# Patient Record
Sex: Female | Born: 2000 | Race: White | Hispanic: No | Marital: Single | Smoking: Never smoker
Health system: Southern US, Community
[De-identification: ages and names within clinical notes are randomized; demographics above are authoritative.]

---
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
## 2021-06-23 ENCOUNTER — Encounter: Payer: Self-pay | Admitting: Behavioral Health

## 2021-06-23 ENCOUNTER — Other Ambulatory Visit: Payer: Self-pay

## 2021-06-23 ENCOUNTER — Ambulatory Visit: Payer: BC Managed Care – PPO | Admitting: Behavioral Health

## 2021-06-23 VITALS — BP 117/69 | HR 101 | Ht 69.0 in | Wt 160.0 lb

## 2021-06-23 DIAGNOSIS — F411 Generalized anxiety disorder: Secondary | ICD-10-CM

## 2021-06-23 DIAGNOSIS — F410 Panic disorder [episodic paroxysmal anxiety] without agoraphobia: Secondary | ICD-10-CM | POA: Diagnosis not present

## 2021-06-23 DIAGNOSIS — F330 Major depressive disorder, recurrent, mild: Secondary | ICD-10-CM | POA: Diagnosis not present

## 2021-06-23 MED ORDER — SERTRALINE HCL 50 MG PO TABS: ORAL_TABLET | ORAL | 1 refills | Status: DC

## 2021-06-23 MED ORDER — PROPRANOLOL HCL 20 MG PO TABS
20.0000 mg | ORAL_TABLET | Freq: Two times a day (BID) | ORAL | 1 refills | Status: DC
Start: 2021-06-23 — End: 2021-07-25

## 2021-06-23 NOTE — Progress Notes (Signed)
[facility] MD/PA/NP Initial Note  06/23/2021 5:21 PM Alicia Garrett  MRN:  [id number]  Chief Complaint:  Chief Complaint   Depression; Anxiety; Establish Care; Panic Attack     HPI:   20 year old female presents to this office for initial visit and to establish care. Her mother Bonita Quin is present with consent. She says that she is getting ready to go back to school attending [employer] and is experiencing increased anxiety with some depression. She says that she has increased irritability and it exacerbates 4-5 days days before the start of menstrual cycle. She does endorse racing thoughts and decreased concentration. Says she sometimes does not want to be social and stays at home. She says that she has experienced two panic attacks recently; one before a tennis match and one before speaking in class. Says her heart was racing and had trouble breathing. Says she fears this continuing. She says her mother and brother take medication for anxiety and depression. Both pt and mother agree that they would like to try medication at this point in time. She says her anxiety level is 6/10 and depression is 5/10. She is sleeping 7-8 hours per night. No mania, no psychosis, No SI/Hi.  No past psychiatric medication trials  Visit Diagnosis:    ICD-10-CM   1. Generalized anxiety disorder  F41.1 sertraline (ZOLOFT) 50 MG tablet    propranolol (INDERAL) 20 MG tablet    2. Mild episode of recurrent major depressive disorder (HCC)  F33.0 sertraline (ZOLOFT) 50 MG tablet    propranolol (INDERAL) 20 MG tablet    3. Panic attack  F41.0       Past Psychiatric History: none  Past Medical History: History reviewed. No pertinent past medical history. History reviewed. No pertinent surgical history.  Family Psychiatric History: depression, anxiety  Family History:  Family History  Problem Relation Age of Onset   Anxiety disorder Mother    Depression Mother    Depression Brother    Anxiety  disorder Brother     Social History:  Social History   Socioeconomic History   Marital status: Single    Spouse name: Not on file   Number of children: Not on file   Years of education: 12   Highest education level: Not on file  Occupational History   Not on file  Tobacco Use   Smoking status: Never   Smokeless tobacco: Never  Substance and Sexual Activity   Alcohol use: Never   Drug use: Never   Sexual activity: Never  Other Topics Concern   Not on file  Social History Narrative   20 year old living at home with mom, dad, and one younger brother. She is leaving to attend [employer] in August as freshmen living on campus.    Social Determinants of Health   Financial Resource Strain: Not on file  Food Insecurity: Not on file  Transportation Needs: Not on file  Physical Activity: Not on file  Stress: Not on file  Social Connections: Not on file    Allergies: No Known Allergies  Metabolic Disorder Labs: No results found for: HGBA1C, MPG No results found for: PROLACTIN No results found for: CHOL, TRIG, HDL, CHOLHDL, VLDL, LDLCALC No results found for: TSH  Therapeutic Level Labs: No results found for: LITHIUM No results found for: VALPROATE No components found for:  CBMZ  Current Medications: Current Outpatient Medications  Medication Sig Dispense Refill   propranolol (INDERAL) 20 MG tablet Take  1 tablet (20 mg total) by mouth 2 (two) times daily. 60 tablet 1   sertraline (ZOLOFT) 50 MG tablet Take 1/2 tablet 25 mg for 7 days. Then take one tablet 50 mg daily. 30 tablet 1   No current facility-administered medications for this visit.    Medication Side Effects: none  Orders placed this visit:  No orders of the defined types were placed in this encounter.   Psychiatric Specialty Exam:  Review of Systems  Constitutional: Negative.   Cardiovascular:  Negative for palpitations.  Allergic/Immunologic: Negative.   Neurological:   Negative for tremors and weakness.  Psychiatric/Behavioral:  Positive for dysphoric mood. The patient is nervous/anxious.    Blood pressure 117/69, pulse (!) 101, height 5\' 9"  (1.753 m), weight 160 lb (72.6 kg).Body mass index is 23.63 kg/m.  General Appearance: Casual, Neat, and Well Groomed  Eye Contact:  Good  Speech:  Clear and Coherent  Volume:  Normal  Mood:  Anxious  Affect:  Anxious  Thought Process:  Coherent  Orientation:  Full (Time, Place, and Person)  Thought Content: Logical   Suicidal Thoughts:  No  Homicidal Thoughts:  No  Memory:  WNL  Judgement:  Good  Insight:  Good  Psychomotor Activity:  Normal  Concentration:  Concentration: Good  Recall:  Good  Fund of Knowledge: Good  Language: Good  Assets:  Desire for Improvement Resilience  ADL's:  Intact  Cognition: WNL  Prognosis:  Good   Screenings:  GAD-7    Flowsheet Row Office Visit from 06/23/2021 in [facility]  Total GAD-7 Score 6      PHQ2-9    Flowsheet Row Office Visit from 06/23/2021 in [facility]  PHQ-2 Total Score 2  PHQ-9 Total Score 6       Receiving Psychotherapy: Yes   Treatment Plan/Recommendations:  Start Zoloft 25 mg for 7 days. Then 50 mg daily. Start Propranolol 20 mg twice daily as need for anxiety and performance anxiety. To report worsening symptoms promptly To follow up in 4 weeks to reassess. Provided emergency contact information Greater than 50% of face to face time with patient was spent on counseling and coordination of care. We discussed present history of depression and anxiety with 2 panic attacks occurring lasting approximately 30 min a piece. We discussed breathing exercises  and healthy coping mechanisms. She agrees to continue therapy on regular basis. Discussed with her that both panic attacks occurred when she felt she was being reviewed. To discuss further with therapist. Educated pt on risk of SSRI's and pregnancy. She is on  [facility].     [facility] NP

## 2021-07-25 ENCOUNTER — Encounter: Payer: Self-pay | Admitting: Behavioral Health

## 2021-07-25 ENCOUNTER — Other Ambulatory Visit: Payer: Self-pay

## 2021-07-25 ENCOUNTER — Ambulatory Visit: Payer: BC Managed Care – PPO | Admitting: Behavioral Health

## 2021-07-25 DIAGNOSIS — F411 Generalized anxiety disorder: Secondary | ICD-10-CM | POA: Diagnosis not present

## 2021-07-25 DIAGNOSIS — F330 Major depressive disorder, recurrent, mild: Secondary | ICD-10-CM

## 2021-07-25 DIAGNOSIS — F410 Panic disorder [episodic paroxysmal anxiety] without agoraphobia: Secondary | ICD-10-CM | POA: Diagnosis not present

## 2021-07-25 MED ORDER — PROPRANOLOL HCL 20 MG PO TABS: 20.0000 mg | ORAL_TABLET | Freq: Two times a day (BID) | ORAL | 3 refills | Status: DC

## 2021-07-25 MED ORDER — SERTRALINE HCL 50 MG PO TABS
ORAL_TABLET | ORAL | 3 refills | Status: DC
Start: 2021-07-25 — End: 2021-09-01

## 2021-07-25 NOTE — Progress Notes (Signed)
[facility] Med Check  Patient ID: Alicia Garrett,  MRN: [id number]  PCP: [facility]  Date of Evaluation: 07/25/2021 Time spent:30 minutes  Chief Complaint:   HISTORY/CURRENT STATUS: HPI 20 year old female presents to this office for follow up and medication management. Her mother is present with her consent. She says that she has been doing really well. She has experienced decreased levels of anxiety and depression. No panic attacks since last visit. She has moved down to [employer] [employer] and is currently attending  first year. She says she has occasional moments of stress and anxiety from all the changes, but understands that some is normal adaption, and requires healthy coping mechanisms. She does not want to adjust her medications at this time. She reports anxiety 2/10 today and depression 1/10. She is sleeping 7-8 hours per night. No mania, no psychosis, no SI/HI.  No prior medication failures  Individual Medical History/ Review of Systems: Changes? :No   Allergies: Patient has no known allergies.  Current Medications:  Current Outpatient Medications:    propranolol (INDERAL) 20 MG tablet, Take 1 tablet (20 mg total) by mouth 2 (two) times daily., Disp: 60 tablet, Rfl: 3   sertraline (ZOLOFT) 50 MG tablet, Take one tablet daily., Disp: 30 tablet, Rfl: 3 Medication Side Effects: none  Family Medical/ Social History: Changes? No  MENTAL HEALTH EXAM:  There were no vitals taken for this visit.There is no height or weight on file to calculate BMI.  General Appearance: Casual, Neat, and Well Groomed  Eye Contact:  Good  Speech:  Clear and Coherent  Volume:  Normal  Mood:  NA  Affect:  Appropriate  Thought Process:  Coherent and Goal Directed  Orientation:  Full (Time, Place, and Person)  Thought Content: Logical   Suicidal Thoughts:  No  Homicidal Thoughts:  No  Memory:  WNL  Judgement:  Fair  Insight:  Fair  Psychomotor Activity:  Normal  Concentration:   Concentration: Good  Recall:  Good  Fund of Knowledge: Fair  Language: Good  Assets:  Desire for Improvement  ADL's:  Intact  Cognition: WNL  Prognosis:  Good    DIAGNOSES:    ICD-10-CM   1. Panic attack  F41.0     2. Generalized anxiety disorder  F41.1 sertraline (ZOLOFT) 50 MG tablet    propranolol (INDERAL) 20 MG tablet    3. Mild episode of recurrent major depressive disorder (HCC)  F33.0 sertraline (ZOLOFT) 50 MG tablet    propranolol (INDERAL) 20 MG tablet      Receiving Psychotherapy: Yes [name], [facility]  RECOMMENDATIONS:   Continue Zoloft 50 mg daily Continue Propranolol 20 mg twice daily as need for anxiety and performance anxiety. To report worsening symptoms promptly To follow up in 3 months to reassess. Provided emergency contact information  Greater than 50% of 20 min face to face time with patient was spent on counseling and coordination of care. We discussed her excellent improvement with anxiety and depression. No panic attacks since last visit. We discussed breathing exercises  and healthy coping mechanisms. She agrees to continue therapy on regular basis.  Reinforced  risk of SSRI's and pregnancy. She is on [facility].          Joan Flores, NP

## 2021-09-01 ENCOUNTER — Telehealth: Payer: Self-pay | Admitting: Behavioral Health

## 2021-09-01 ENCOUNTER — Other Ambulatory Visit: Payer: Self-pay

## 2021-09-01 DIAGNOSIS — F330 Major depressive disorder, recurrent, mild: Secondary | ICD-10-CM

## 2021-09-01 DIAGNOSIS — F411 Generalized anxiety disorder: Secondary | ICD-10-CM

## 2021-09-01 MED ORDER — SERTRALINE HCL 50 MG PO TABS: ORAL_TABLET | ORAL | 2 refills | Status: DC

## 2021-09-01 NOTE — Telephone Encounter (Signed)
Pt called in for refill on Zoloft  [id number], [place] - [address]  [address] [place] [address]  Phone:  [phone]  Fax:  [phone]  Next appt 12/21

## 2021-09-01 NOTE — Telephone Encounter (Signed)
Rx sent 

## 2021-10-27 ENCOUNTER — Other Ambulatory Visit: Payer: Self-pay | Admitting: Behavioral Health

## 2021-10-27 DIAGNOSIS — F411 Generalized anxiety disorder: Secondary | ICD-10-CM

## 2021-10-27 DIAGNOSIS — F330 Major depressive disorder, recurrent, mild: Secondary | ICD-10-CM

## 2021-11-12 ENCOUNTER — Ambulatory Visit: Payer: BC Managed Care – PPO | Admitting: Behavioral Health

## 2021-11-12 ENCOUNTER — Encounter: Payer: Self-pay | Admitting: Behavioral Health

## 2021-11-12 ENCOUNTER — Other Ambulatory Visit: Payer: Self-pay

## 2021-11-12 DIAGNOSIS — F410 Panic disorder [episodic paroxysmal anxiety] without agoraphobia: Secondary | ICD-10-CM

## 2021-11-12 DIAGNOSIS — F411 Generalized anxiety disorder: Secondary | ICD-10-CM

## 2021-11-12 DIAGNOSIS — F330 Major depressive disorder, recurrent, mild: Secondary | ICD-10-CM | POA: Diagnosis not present

## 2021-11-12 MED ORDER — SERTRALINE HCL 50 MG PO TABS: ORAL_TABLET | ORAL | 0 refills | Status: AC

## 2021-11-12 MED ORDER — SERTRALINE HCL 50 MG PO TABS: 50.0000 mg | ORAL_TABLET | Freq: Every day | ORAL | 5 refills | Status: DC

## 2021-11-12 NOTE — Progress Notes (Signed)
[facility] Med [facility]  Patient ID: Alicia Garrett,  MRN: [id number]  PCP: [facility]  Date of Evaluation: 11/12/2021 Time spent:20 minutes  Chief Complaint:  Chief Complaint   Anxiety; Depression; Follow-up; Medication Refill     HISTORY/CURRENT STATUS: HPI  20 year old female presents to this office for follow up and medication management. Her mother is present with her consent. She says that she has been doing really well. She has experienced decreased levels of anxiety and depression. No panic attacks since last visit.  She says she has occasional moments of stress and anxiety from all the changes, but understands that some is normal adaption, and requires healthy coping mechanisms. Her chemistry was challenging and she opted for Pass/Fail. She is making B's in other classes. She does not want to adjust her medications at this time. Mother says that she is seeing improvement with medication and therapy. She reports anxiety 2/10 today and depression 1/10. She is sleeping 7-8 hours per night. No mania, no psychosis, no SI/HI.   No prior medication failures      Individual Medical History/ Review of Systems: Changes? :No   Allergies: Patient has no known allergies.  Current Medications:  Current Outpatient Medications:    propranolol (INDERAL) 20 MG tablet, Take 1 tablet (20 mg total) by mouth 2 (two) times daily., Disp: 60 tablet, Rfl: 3   sertraline (ZOLOFT) 50 MG tablet, TAKE 1 TABLET BY MOUTH EVERY DAY, Disp: 30 tablet, Rfl: 0   sertraline (ZOLOFT) 50 MG tablet, Take 1 tablet (50 mg total) by mouth daily., Disp: 30 tablet, Rfl: 5 Medication Side Effects: none  Family Medical/ Social History: Changes? No  MENTAL HEALTH EXAM:  There were no vitals taken for this visit.There is no height or weight on file to calculate BMI.  General Appearance: Casual  Eye Contact:  Good  Speech:  Clear and Coherent  Volume:  Normal  Mood:  NA  Affect:  Appropriate   Thought Process:  Coherent  Orientation:  Full (Time, Place, and Person)  Thought Content: Logical   Suicidal Thoughts:  No  Homicidal Thoughts:  No  Memory:  WNL  Judgement:  Good  Insight:  Good  Psychomotor Activity:  Normal  Concentration:  Concentration: Good  Recall:  Good  Fund of Knowledge: Good  Language: Good  Assets:  Desire for Improvement  ADL's:  Intact  Cognition: WNL  Prognosis:  Good    DIAGNOSES:    ICD-10-CM   1. Generalized anxiety disorder  F41.1 sertraline (ZOLOFT) 50 MG tablet    sertraline (ZOLOFT) 50 MG tablet    2. Mild episode of recurrent major depressive disorder (HCC)  F33.0 sertraline (ZOLOFT) 50 MG tablet    sertraline (ZOLOFT) 50 MG tablet    3. Panic attack  F41.0       Receiving Psychotherapy: Yes, Sascha Burnette   RECOMMENDATIONS:   Continue Zoloft 50 mg daily Continue Propranolol 20 mg twice daily as need for anxiety and performance anxiety. To report worsening symptoms promptly To follow up in 6  months to reassess when she is on summer break as requested by pt Provided emergency contact information   Greater than 50% of 20 min face to face time with patient was spent on counseling and coordination of care. We discussed her excellent improvement with anxiety and depression. No panic attacks since last visit.  Her mother usually come in with her on visits. Im not sure she feels comfortable enough to be fully candid  especially about college. She reassures that she is doing well.   We discussed breathing exercises  and healthy coping mechanisms. She agrees to continue therapy on regular basis.  She continues in psychotherapy on regular basis Reinforced  risk of SSRI's and pregnancy. She is on [facility].        Joan Flores, NP

## 2022-04-13 ENCOUNTER — Ambulatory Visit: Payer: BC Managed Care – PPO | Admitting: Behavioral Health

## 2022-05-04 ENCOUNTER — Other Ambulatory Visit: Payer: Self-pay

## 2022-05-04 ENCOUNTER — Telehealth: Payer: Self-pay | Admitting: Behavioral Health

## 2022-05-04 DIAGNOSIS — F330 Major depressive disorder, recurrent, mild: Secondary | ICD-10-CM

## 2022-05-04 DIAGNOSIS — F411 Generalized anxiety disorder: Secondary | ICD-10-CM

## 2022-05-04 MED ORDER — SERTRALINE HCL 50 MG PO TABS: 50.0000 mg | ORAL_TABLET | Freq: Every day | ORAL | 0 refills | Status: DC

## 2022-05-04 NOTE — Telephone Encounter (Signed)
Rx sent 

## 2022-05-04 NOTE — Telephone Encounter (Signed)
Pt requesting RF on Sertraline 50 mg 1/d [facility]. Apt 6/22.

## 2022-05-14 ENCOUNTER — Encounter: Payer: Self-pay | Admitting: Behavioral Health

## 2022-05-14 ENCOUNTER — Ambulatory Visit: Payer: BC Managed Care – PPO | Admitting: Behavioral Health

## 2022-05-14 DIAGNOSIS — F411 Generalized anxiety disorder: Secondary | ICD-10-CM | POA: Diagnosis not present

## 2022-05-14 DIAGNOSIS — F330 Major depressive disorder, recurrent, mild: Secondary | ICD-10-CM

## 2022-05-14 MED ORDER — SERTRALINE HCL 50 MG PO TABS: 50.0000 mg | ORAL_TABLET | Freq: Every day | ORAL | 3 refills | Status: DC

## 2022-05-14 MED ORDER — PROPRANOLOL HCL 20 MG PO TABS: 20.0000 mg | ORAL_TABLET | Freq: Two times a day (BID) | ORAL | 3 refills | Status: DC

## 2022-05-14 NOTE — Progress Notes (Signed)
[facility]  Patient ID: Alicia Garrett,  MRN: [id number]  PCP: [facility]  Date of Evaluation: 05/14/2022 Time spent:20 minutes  Chief Complaint:  Chief Complaint   Anxiety; Depression; Follow-up; Medication Refill     HISTORY/CURRENT STATUS: HPI 21 year old female presents to this office for follow up and medication management. Her mother is present with her consent. No changes this visit. She is smiling and joyful. Significant change from last visit. She says that she has been doing really well. Completed her first year of college. Denies romantic relationship but looks towards mom when these types of questions asked. She and her family have a lot of travel scheduled for summer. She has experienced decreased levels of anxiety and depression. No panic attacks since last visit.  She says she has occasional moments of stress and anxiety from all the changes, but understands that some is normal adaption, and requires healthy coping mechanisms.  She does not want to adjust her medications at this time. Mother says that she is seeing improvement with medication and therapy. She reports anxiety 2/10 today and depression 1/10. She is sleeping 7-8 hours per night. No mania, no psychosis, no SI/HI.   No prior medication failures       Individual Medical History/ Review of Systems: Changes? :No   Allergies: Patient has no known allergies.  Current Medications:  Current Outpatient Medications:    propranolol (INDERAL) 20 MG tablet, Take 1 tablet (20 mg total) by mouth 2 (two) times daily., Disp: 60 tablet, Rfl: 3   sertraline (ZOLOFT) 50 MG tablet, TAKE 1 TABLET BY MOUTH EVERY DAY, Disp: 30 tablet, Rfl: 0   sertraline (ZOLOFT) 50 MG tablet, Take 1 tablet (50 mg total) by mouth daily., Disp: 30 tablet, Rfl: 3 Medication Side Effects: none  Family Medical/ Social History: Changes? No  MENTAL HEALTH EXAM:  There were no vitals taken for this visit.There is no  height or weight on file to calculate BMI.  General Appearance: Casual, Neat, and Well Groomed  Eye Contact:  Good  Speech:  Clear and Coherent  Volume:  Normal  Mood:  NA  Affect:  Appropriate  Thought Process:  Coherent  Orientation:  Full (Time, Place, and Person)  Thought Content: Logical   Suicidal Thoughts:  No  Homicidal Thoughts:  No  Memory:  WNL  Judgement:  Good  Insight:  Good  Psychomotor Activity:  Normal  Concentration:  Concentration: Good  Recall:  Good  Fund of Knowledge: Good  Language: Good  Assets:  Desire for Improvement  ADL's:  Intact  Cognition: WNL  Prognosis:  Good    DIAGNOSES:    ICD-10-CM   1. Generalized anxiety disorder  F41.1 propranolol (INDERAL) 20 MG tablet    sertraline (ZOLOFT) 50 MG tablet    2. Mild episode of recurrent major depressive disorder (HCC)  F33.0 propranolol (INDERAL) 20 MG tablet    sertraline (ZOLOFT) 50 MG tablet      Receiving Psychotherapy: No    RECOMMENDATIONS:  Continue Zoloft 50 mg daily Continue Propranolol 20 mg twice daily as need for anxiety and performance anxiety. To report worsening symptoms promptly To follow up in 6  months to reassess when she is on summer break as requested by pt Provided emergency contact information   Greater than 50% of 20 min face to face time with patient was spent on counseling and coordination of care. No changes this visit. We discussed her excellent improvement with anxiety and depression.  No panic attacks since last visit.  Her mother usually come in with her on visits. Im not sure she feels comfortable enough to be fully candid especially about college. Looks towards mom when asked these types of questions.   We discussed breathing exercises  and healthy coping mechanisms. She agrees to continue therapy on regular basis.  She continues in psychotherapy on regular basis Reinforced  risk of SSRI's and pregnancy. She is on [facility]. [facility] within one week.              Joan Flores, NP

## 2022-09-09 ENCOUNTER — Other Ambulatory Visit: Payer: Self-pay

## 2022-09-09 ENCOUNTER — Telehealth: Payer: Self-pay | Admitting: Behavioral Health

## 2022-09-09 DIAGNOSIS — F411 Generalized anxiety disorder: Secondary | ICD-10-CM

## 2022-09-09 DIAGNOSIS — F330 Major depressive disorder, recurrent, mild: Secondary | ICD-10-CM

## 2022-09-09 MED ORDER — SERTRALINE HCL 50 MG PO TABS: 50.0000 mg | ORAL_TABLET | Freq: Every day | ORAL | 3 refills | Status: DC

## 2022-09-09 NOTE — Telephone Encounter (Signed)
Pt called need new Rx for Sertraline out of [facility]. [facility] [place] Apt 12/18

## 2022-09-09 NOTE — Telephone Encounter (Signed)
Rx sent 

## 2022-09-23 ENCOUNTER — Other Ambulatory Visit: Payer: Self-pay | Admitting: Behavioral Health

## 2022-09-23 DIAGNOSIS — F411 Generalized anxiety disorder: Secondary | ICD-10-CM

## 2022-09-23 DIAGNOSIS — F330 Major depressive disorder, recurrent, mild: Secondary | ICD-10-CM

## 2022-11-09 ENCOUNTER — Encounter: Payer: Self-pay | Admitting: Behavioral Health

## 2022-11-09 ENCOUNTER — Ambulatory Visit: Payer: BC Managed Care – PPO | Admitting: Behavioral Health

## 2022-11-09 DIAGNOSIS — F330 Major depressive disorder, recurrent, mild: Secondary | ICD-10-CM | POA: Diagnosis not present

## 2022-11-09 DIAGNOSIS — F411 Generalized anxiety disorder: Secondary | ICD-10-CM | POA: Diagnosis not present

## 2022-11-09 MED ORDER — PROPRANOLOL HCL 20 MG PO TABS: 20.0000 mg | ORAL_TABLET | Freq: Two times a day (BID) | ORAL | 3 refills | Status: AC

## 2022-11-09 MED ORDER — SERTRALINE HCL 100 MG PO TABS: 100.0000 mg | ORAL_TABLET | Freq: Every day | ORAL | 1 refills | Status: AC

## 2022-11-09 NOTE — Progress Notes (Signed)
[facility]  Patient ID: Alicia Garrett,  MRN: [id number]  PCP: [facility]  Date of Evaluation: 11/09/2022 Time spent:30 minutes  Chief Complaint:  Chief Complaint   Anxiety; Depression; Follow-up; Medication Refill; Patient Education; Stress     HISTORY/CURRENT STATUS: HPI 21 year old female presents to this office for follow up and medication management.  She is smiling and joyful but appears anxious. Says that she has seen an increase in anxiety and depression this semester. Feels like she could use an increase with her Zoloft.  She is in new relationship with man. They have been dating about 3 months.  She reports anxiety 2/10 today and depression 1/10. She is sleeping 7-8 hours per night. No mania, no psychosis, no SI/HI.   No prior medication failures  Individual Medical History/ Review of Systems: Changes? :No   Allergies: Patient has no known allergies.  Current Medications:  Current Outpatient Medications:    sertraline (ZOLOFT) 100 MG tablet, Take 1 tablet (100 mg total) by mouth daily., Disp: 90 tablet, Rfl: 1   propranolol (INDERAL) 20 MG tablet, Take 1 tablet (20 mg total) by mouth 2 (two) times daily., Disp: 180 tablet, Rfl: 3   sertraline (ZOLOFT) 50 MG tablet, TAKE 1 TABLET BY MOUTH EVERY DAY, Disp: 30 tablet, Rfl: 0   sertraline (ZOLOFT) 50 MG tablet, Take 1 tablet (50 mg total) by mouth daily., Disp: 30 tablet, Rfl: 3 Medication Side Effects: none  Family Medical/ Social History: Changes? No  MENTAL HEALTH EXAM:  Last menstrual period 10/23/2022.There is no height or weight on file to calculate BMI.  General Appearance: Casual, Neat, and Well Groomed  Eye Contact:  Good  Speech:  Clear and Coherent  Volume:  Normal  Mood:  Anxious, Depressed, and Dysphoric  Affect:  Non-Congruent, Depressed, and Anxious  Thought Process:  Coherent  Orientation:  Full (Time, Place, and Person)  Thought Content: Logical   Suicidal Thoughts:   No  Homicidal Thoughts:  No  Memory:  WNL  Judgement:  Good  Insight:  Good  Psychomotor Activity:  Normal  Concentration:  Concentration: Good  Recall:  Good  Fund of Knowledge: Good  Language: Good  Assets:  Desire for Improvement  ADL's:  Intact  Cognition: WNL  Prognosis:  Good    DIAGNOSES:    ICD-10-CM   1. Generalized anxiety disorder  F41.1 propranolol (INDERAL) 20 MG tablet    sertraline (ZOLOFT) 100 MG tablet    2. Mild episode of recurrent major depressive disorder (HCC)  F33.0 propranolol (INDERAL) 20 MG tablet    sertraline (ZOLOFT) 100 MG tablet      Receiving Psychotherapy:yes, Sascha Burnette   RECOMMENDATIONS:   Increase  Zoloft to 100 mg daily Continue Propranolol 20 mg twice daily as need for anxiety and performance anxiety. To report worsening symptoms promptly To follow up in 3 months to reassess when she is on summer break as requested by pt Provided emergency contact information   Greater than 50% of 30  min face to face time with patient was spent on counseling and coordination of care. We discussed her increased depressive and anxietal symptoms this semester in school. She feels like she could benefit from increase of her zoloft.     We discussed breathing exercises  and healthy coping mechanisms. She agrees to continue therapy on regular basis.  She continues in psychotherapy on regular basis Reinforced  risk of SSRI's and pregnancy. She is on [facility]. [facility] 12/01  Elwanda Brooklyn, NP

## 2023-01-25 ENCOUNTER — Other Ambulatory Visit: Payer: Self-pay | Admitting: Behavioral Health

## 2023-02-02 ENCOUNTER — Encounter: Payer: Self-pay | Admitting: Behavioral Health

## 2023-02-02 ENCOUNTER — Ambulatory Visit: Payer: BC Managed Care – PPO | Admitting: Behavioral Health

## 2023-02-02 DIAGNOSIS — F330 Major depressive disorder, recurrent, mild: Secondary | ICD-10-CM | POA: Diagnosis not present

## 2023-02-02 DIAGNOSIS — F331 Major depressive disorder, recurrent, moderate: Secondary | ICD-10-CM | POA: Diagnosis not present

## 2023-02-02 DIAGNOSIS — F411 Generalized anxiety disorder: Secondary | ICD-10-CM | POA: Diagnosis not present

## 2023-02-02 MED ORDER — SERTRALINE HCL 50 MG PO TABS: 75.0000 mg | ORAL_TABLET | Freq: Every day | ORAL | 2 refills | Status: DC

## 2023-02-02 NOTE — Progress Notes (Signed)
[facility]  Patient ID: Alicia Garrett,  MRN: [id number]  PCP: [place]  Date of Evaluation: 02/02/2023 Time spent:30 minutes  Chief Complaint:  Chief Complaint   Anxiety; Depression; Medication Problem; Medication Refill; Follow-up     HISTORY/CURRENT STATUS: HPI 22 year old female presents to this office for follow up and medication management.  She is smiling and joyful but appears anxious. Says that she has experienced nausea and loss of appetite since increasing Zoloft to 100 mg. She would like to reduce to see if this help before considering a switch.  She is in new relationship with man. They have been dating about 3 months.  She reports anxiety 2/10 today and depression 1/10. She is sleeping 7-8 hours per night. No mania, no psychosis, no SI/HI.   No prior medication failures     Individual Medical History/ Review of Systems: Changes? :No   Allergies: Patient has no known allergies.  Current Medications:  Current Outpatient Medications:    propranolol (INDERAL) 20 MG tablet, Take 1 tablet (20 mg total) by mouth 2 (two) times daily., Disp: 180 tablet, Rfl: 3   sertraline (ZOLOFT) 100 MG tablet, Take 1 tablet (100 mg total) by mouth daily., Disp: 90 tablet, Rfl: 1   sertraline (ZOLOFT) 50 MG tablet, TAKE 1 TABLET BY MOUTH EVERY DAY, Disp: 30 tablet, Rfl: 0   sertraline (ZOLOFT) 50 MG tablet, Take 1.5 tablets (75 mg total) by mouth daily., Disp: 45 tablet, Rfl: 2 Medication Side Effects: none  Family Medical/ Social History: Changes? No  MENTAL HEALTH EXAM:  There were no vitals taken for this visit.There is no height or weight on file to calculate BMI.  General Appearance: Casual, Neat, and Well Groomed  Eye Contact:  Good  Speech:  Clear and Coherent  Volume:  Normal  Mood:  Anxious, Depressed, and Dysphoric  Affect:  Appropriate  Thought Process:  Coherent  Orientation:  Full (Time, Place, and Person)  Thought Content: Logical    Suicidal Thoughts:  No  Homicidal Thoughts:  No  Memory:  WNL  Judgement:  Good  Insight:  Good  Psychomotor Activity:  Normal  Concentration:  Concentration: Good  Recall:  Good  Fund of Knowledge: Good  Language: Good  Assets:  Desire for Improvement  ADL's:  Intact  Cognition: WNL  Prognosis:  Good    DIAGNOSES:    ICD-10-CM   1. Major depressive disorder, recurrent episode, moderate (HCC)  F33.1     2. Generalized anxiety disorder  F41.1 sertraline (ZOLOFT) 50 MG tablet    3. Mild episode of recurrent major depressive disorder (HCC)  F33.0 sertraline (ZOLOFT) 50 MG tablet      Receiving Psychotherapy: No    RECOMMENDATIONS:   Decrease Zoloft to 75 mg daily Continue Propranolol 20 mg twice daily as need for anxiety and performance anxiety. To report worsening symptoms promptly To follow up in 2 months to reassess when school is out for the summer. Provided emergency contact information   Greater than 50% of 30  min face to face time with patient was spent on counseling and coordination of care. She has increased stressors due to school but wants to try decreasing Zoloft to see if it helps with anxiety.  We discussed breathing exercises  and healthy coping mechanisms. She agrees to continue therapy on regular basis.  She continues in psychotherapy on regular basis Will call with worsening symptoms  Provided emergency contact information To follow up in  mid may  to reassess Reinforced  risk of SSRI's and pregnancy. She is on [facility]. [facility] 01/23/22     Elwanda Brooklyn, NP

## 2023-02-24 ENCOUNTER — Other Ambulatory Visit: Payer: Self-pay | Admitting: Behavioral Health

## 2023-02-24 DIAGNOSIS — F330 Major depressive disorder, recurrent, mild: Secondary | ICD-10-CM

## 2023-02-24 DIAGNOSIS — F411 Generalized anxiety disorder: Secondary | ICD-10-CM

## 2023-04-05 ENCOUNTER — Ambulatory Visit (INDEPENDENT_AMBULATORY_CARE_PROVIDER_SITE_OTHER): Payer: Self-pay | Admitting: Behavioral Health

## 2023-04-05 DIAGNOSIS — Z0389 Encounter for observation for other suspected diseases and conditions ruled out: Secondary | ICD-10-CM

## 2023-04-05 NOTE — Progress Notes (Signed)
Patient did not show for scheduled appointment and did not provide 24 hour notice as required. Additional fees to be assessed.  

## 2023-05-15 ENCOUNTER — Other Ambulatory Visit: Payer: Self-pay | Admitting: Behavioral Health

## 2023-05-15 DIAGNOSIS — F330 Major depressive disorder, recurrent, mild: Secondary | ICD-10-CM

## 2023-05-15 DIAGNOSIS — F411 Generalized anxiety disorder: Secondary | ICD-10-CM

## 2023-05-16 NOTE — Telephone Encounter (Signed)
Please call to schedule an appt, was a no show last one  

## 2023-05-17 NOTE — Telephone Encounter (Signed)
Lvm for pt to call and schedule 

## 2023-07-18 ENCOUNTER — Other Ambulatory Visit: Payer: Self-pay | Admitting: Behavioral Health

## 2023-07-18 DIAGNOSIS — F411 Generalized anxiety disorder: Secondary | ICD-10-CM

## 2023-07-18 DIAGNOSIS — F330 Major depressive disorder, recurrent, mild: Secondary | ICD-10-CM

## 2023-08-10 ENCOUNTER — Encounter: Payer: Self-pay | Admitting: Behavioral Health

## 2023-08-10 ENCOUNTER — Ambulatory Visit: Payer: BC Managed Care – PPO | Admitting: Behavioral Health

## 2023-08-10 DIAGNOSIS — F411 Generalized anxiety disorder: Secondary | ICD-10-CM

## 2023-08-10 DIAGNOSIS — F330 Major depressive disorder, recurrent, mild: Secondary | ICD-10-CM

## 2023-08-10 NOTE — Progress Notes (Signed)
[facility]  Patient ID: Alicia Garrett,  MRN: [id number]  PCP: [facility]  Date of Evaluation: 08/10/2023 Time spent:30 minutes  Chief Complaint:  Chief Complaint   Depression; Anxiety; Follow-up; Medication Refill; Patient Education       I connected with pt @ on 06/09/23 at  10:00 AM EDT by telephonic interview and verified that I am speaking with the correct person using two identifiers.   I discussed the limitations of evaluation and management by telemedicine and the availability of in person appointments. The patient expressed understanding and agreed to proceed.   I discussed the assessment and treatment plan with the patient. The patient was provided an opportunity to ask questions and all were answered. The patient agreed with the plan and demonstrated an understanding of the instructions.   The patient was advised to call back or seek an in-person evaluation if the symptoms worsen or if the condition fails to improve as anticipated.   I provided 30 minutes of non-face-to-face time during this encounter.  The patient was located at home.  The provider was located at [facility].   HISTORY/CURRENT STATUS: HPI  22 year old female presents to this office for follow up and medication management.  Says she is doing very well on current medications. She is currently back in college for the semester.  She and her boyfriend are doing great, been together for one year now.  She reports anxiety 2/10 today and depression 1/10. She is sleeping 7-8 hours per night. No mania, no psychosis, no SI/HI.   No prior medication failures       Individual Medical History/ Review of Systems: Changes? :No   Allergies: Patient has no known allergies.  Current Medications:  Current Outpatient Medications:    propranolol (INDERAL) 20 MG tablet, Take 1 tablet (20 mg total) by mouth 2 (two) times daily., Disp: 180 tablet, Rfl: 3   sertraline (ZOLOFT)  100 MG tablet, Take 1 tablet (100 mg total) by mouth daily., Disp: 90 tablet, Rfl: 1   sertraline (ZOLOFT) 50 MG tablet, TAKE 1 TABLET BY MOUTH EVERY DAY, Disp: 30 tablet, Rfl: 0   sertraline (ZOLOFT) 50 MG tablet, Take 1.5 tablets (75 mg total) by mouth daily., Disp: 45 tablet, Rfl: 2 Medication Side Effects: none  Family Medical/ Social History: Changes? No  MENTAL HEALTH EXAM:  There were no vitals taken for this visit.There is no height or weight on file to calculate BMI.  General Appearance: Casual, Neat, and Well Groomed  Eye Contact:  Good  Speech:  Clear and Coherent  Volume:  Normal  Mood:  NA  Affect:  Appropriate  Thought Process:  Coherent  Orientation:  Full (Time, Place, and Person)  Thought Content: Logical   Suicidal Thoughts:  No  Homicidal Thoughts:  No  Memory:  WNL  Judgement:  Good  Insight:  Good  Psychomotor Activity:  Normal  Concentration:  Concentration: Good  Recall:  Good  Fund of Knowledge: Good  Language: Good  Assets:  Desire for Improvement  ADL's:  Intact  Cognition: WNL  Prognosis:  Good    DIAGNOSES:    ICD-10-CM   1. Generalized anxiety disorder  F41.1     2. Mild episode of recurrent major depressive disorder (HCC)  F33.0       Receiving Psychotherapy: No    RECOMMENDATIONS:   Continue  Zoloft to 75 mg daily Continue Propranolol 20 mg twice daily as need for anxiety and performance anxiety. To  report worsening symptoms promptly To follow up in 3 months to reassess when school is out for the summer. Provided emergency contact information   Greater than 50% of 30  min telephonic interview with patient was spent on counseling and coordination of care. Very happy and content right now. We talked about her good stability. No changes or adjustments to medication is indicated.  She continues in psychotherapy on regular basis Will call with worsening symptoms  Provided emergency contact information To follow up in  mid may to  reassess Reinforced  risk of SSRI's and pregnancy. She is on [facility]. [facility] 08/05/2023     Joan Flores, NP

## 2023-11-11 ENCOUNTER — Encounter: Payer: Self-pay | Admitting: Behavioral Health

## 2023-11-11 ENCOUNTER — Ambulatory Visit: Payer: BC Managed Care – PPO | Admitting: Behavioral Health

## 2023-11-11 DIAGNOSIS — F411 Generalized anxiety disorder: Secondary | ICD-10-CM | POA: Diagnosis not present

## 2023-11-11 DIAGNOSIS — F330 Major depressive disorder, recurrent, mild: Secondary | ICD-10-CM

## 2023-11-11 MED ORDER — HYDROXYZINE HCL 25 MG PO TABS: 25.0000 mg | ORAL_TABLET | Freq: Three times a day (TID) | ORAL | 0 refills | Status: DC | PRN

## 2023-11-11 MED ORDER — SERTRALINE HCL 50 MG PO TABS: 75.0000 mg | ORAL_TABLET | Freq: Every day | ORAL | 1 refills | Status: DC

## 2023-11-11 MED ORDER — PROPRANOLOL HCL 40 MG PO TABS: ORAL_TABLET | ORAL | 3 refills | Status: AC

## 2023-11-11 NOTE — Progress Notes (Signed)
[facility]  Patient ID: Alicia Garrett,  MRN: [id number]  PCP: [facility]  Date of Evaluation: 11/11/2023 Time spent:30 minutes  Chief Complaint:  Chief Complaint   Anxiety; Depression; Follow-up; Patient Education; Medication Refill     HISTORY/CURRENT STATUS: HPI  22 year old female presents to this office for follow up and medication management.  Says she is doing very well on current medications overall. She is finding that Inderal is not as effective as before. Requesting dose increase if possible.  Home from college for winter break.  She and her boyfriend are doing great, been together for 1.5 years now.  She reports anxiety 2/10 today and depression 1/10. She is sleeping 7-8 hours per night. No mania, no psychosis, no SI/HI.   No prior medication failures       Individual Medical History/ Review of Systems: Changes? :No   Allergies: Patient has no known allergies.  Current Medications:  Current Outpatient Medications:    hydrOXYzine (ATARAX) 25 MG tablet, Take 1 tablet (25 mg total) by mouth 3 (three) times daily as needed., Disp: 90 tablet, Rfl: 0   propranolol (INDERAL) 40 MG tablet, Take one tablet by mouth daily as needed for anxiety, Disp: 30 tablet, Rfl: 3   NIKKI 3-0.02 MG tablet, Take 1 tablet by mouth daily., Disp: , Rfl:    propranolol (INDERAL) 20 MG tablet, Take 1 tablet (20 mg total) by mouth 2 (two) times daily., Disp: 180 tablet, Rfl: 3   sertraline (ZOLOFT) 100 MG tablet, Take 1 tablet (100 mg total) by mouth daily., Disp: 90 tablet, Rfl: 1   sertraline (ZOLOFT) 50 MG tablet, TAKE 1 TABLET BY MOUTH EVERY DAY, Disp: 30 tablet, Rfl: 0   sertraline (ZOLOFT) 50 MG tablet, Take 1.5 tablets (75 mg total) by mouth daily., Disp: 135 tablet, Rfl: 1 Medication Side Effects: none  Family Medical/ Social History: Changes? No  MENTAL HEALTH EXAM:  There were no vitals taken for this visit.There is no height or weight on file to  calculate BMI.  General Appearance: Casual and Neat  Eye Contact:  Good  Speech:  Clear and Coherent  Volume:  Normal  Mood:  NA  Affect:  Appropriate  Thought Process:  Coherent  Orientation:  Full (Time, Place, and Person)  Thought Content: Logical   Suicidal Thoughts:  No  Homicidal Thoughts:  No  Memory:  WNL  Judgement:  Good  Insight:  Good  Psychomotor Activity:  Normal  Concentration:  Concentration: Good  Recall:  Good  Fund of Knowledge: Good  Language: Good  Assets:  Desire for Improvement  ADL's:  Intact  Cognition: WNL  Prognosis:  Good    DIAGNOSES:    ICD-10-CM   1. Generalized anxiety disorder  F41.1 sertraline (ZOLOFT) 50 MG tablet    hydrOXYzine (ATARAX) 25 MG tablet    propranolol (INDERAL) 40 MG tablet    2. Mild episode of recurrent major depressive disorder (HCC)  F33.0 sertraline (ZOLOFT) 50 MG tablet      Receiving Psychotherapy: No    RECOMMENDATIONS:   Continue  Zoloft to 75 mg daily Increase Propranolol to 40 mg twice daily as need for anxiety and performance anxiety. To start Hydroxyzine 25 mg three time daily as needed for anxiety. To report worsening symptoms promptly To follow up in 6 months to reassess when school is out for the summer. Provided emergency contact information   Greater than 50% of 30  min telephonic interview with patient was  spent on counseling and coordination of care. Very happy and content right now. We talked about her good stability. No changes or adjustments to medication is indicated.  She continues in psychotherapy on regular basis Will call with worsening symptoms  Provided emergency contact information To follow up in  mid may to reassess Reinforced  risk of SSRI's and pregnancy. She is on [facility].       Joan Flores, NP

## 2023-11-25 ENCOUNTER — Other Ambulatory Visit: Payer: Self-pay | Admitting: Behavioral Health

## 2023-11-25 DIAGNOSIS — F411 Generalized anxiety disorder: Secondary | ICD-10-CM

## 2024-04-26 ENCOUNTER — Ambulatory Visit: Payer: BC Managed Care – PPO | Admitting: Behavioral Health

## 2024-06-21 ENCOUNTER — Other Ambulatory Visit: Payer: Self-pay | Admitting: Behavioral Health

## 2024-06-23 ENCOUNTER — Encounter: Payer: Self-pay | Admitting: Behavioral Health

## 2024-06-23 ENCOUNTER — Ambulatory Visit: Payer: Self-pay | Admitting: Behavioral Health

## 2024-06-23 DIAGNOSIS — F411 Generalized anxiety disorder: Secondary | ICD-10-CM

## 2024-06-23 DIAGNOSIS — F330 Major depressive disorder, recurrent, mild: Secondary | ICD-10-CM

## 2024-06-23 MED ORDER — SERTRALINE HCL 50 MG PO TABS: 75.0000 mg | ORAL_TABLET | Freq: Every day | ORAL | 1 refills | Status: AC

## 2024-06-23 NOTE — Progress Notes (Signed)
 [facility]  Patient ID: Alicia Garrett,  MRN: [id number]  PCP: [facility]  Date of Evaluation: 06/23/2024 Time spent:20 minutes  Chief Complaint:  Chief Complaint   Anxiety; Depression; Follow-up; Patient Education; Medication Refill     HISTORY/CURRENT STATUS: HPI Alicia Garrett, 23 year old female presents to this office for follow up and medication management.  Says she is doing very well on current medications overall.  Finds that hydroxyzine  is working well when she needs it but not using very often. Starts her senior year at [employer] Aug 9th.   Broke up with long term boyfriend but says she is coping well.  She reports anxiety 2/10 today and depression 1/10. She is sleeping 7-8 hours per night. No mania, no psychosis, no SI/HI. Continues in psychotherapy       Individual Medical History/ Review of Systems: Changes? :No   Allergies: Patient has no known allergies.  Current Medications:  Current Outpatient Medications:    hydrOXYzine  (ATARAX ) 25 MG tablet, TAKE 1 TABLET BY MOUTH THREE TIMES A DAY AS NEEDED, Disp: 270 tablet, Rfl: 1   NIKKI 3-0.02 MG tablet, Take 1 tablet by mouth daily., Disp: , Rfl:    propranolol  (INDERAL ) 20 MG tablet, Take 1 tablet (20 mg total) by mouth 2 (two) times daily., Disp: 180 tablet, Rfl: 3   propranolol  (INDERAL ) 40 MG tablet, Take one tablet by mouth daily as needed for anxiety, Disp: 30 tablet, Rfl: 3   sertraline  (ZOLOFT ) 100 MG tablet, Take 1 tablet (100 mg total) by mouth daily., Disp: 90 tablet, Rfl: 1   sertraline  (ZOLOFT ) 50 MG tablet, TAKE 1 TABLET BY MOUTH EVERY DAY, Disp: 30 tablet, Rfl: 0   sertraline  (ZOLOFT ) 50 MG tablet, Take 1.5 tablets (75 mg total) by mouth daily., Disp: 135 tablet, Rfl: 1 Medication Side Effects: none  Family Medical/ Social History: Changes? No  MENTAL HEALTH EXAM:  There were no vitals taken for this visit.There is no height or weight on file to calculate BMI.  General  Appearance: Casual, Neat, and Well Groomed  Eye Contact:  Good  Speech:  Clear and Coherent  Volume:  Normal  Mood:  NA  Affect:  Appropriate  Thought Process:  Coherent  Orientation:  Full (Time, Place, and Person)  Thought Content: Logical   Suicidal Thoughts:  No  Homicidal Thoughts:  No  Memory:  WNL  Judgement:  Good  Insight:  Good  Psychomotor Activity:  Normal  Concentration:  Concentration: Good  Recall:  Good  Fund of Knowledge: Good  Language: Good  Assets:  Desire for Improvement  ADL's:  Intact  Cognition: WNL  Prognosis:  Good    DIAGNOSES:    ICD-10-CM   1. Mild episode of recurrent major depressive disorder (HCC)  F33.0     2. Generalized anxiety disorder  F41.1       Receiving Psychotherapy: No    RECOMMENDATIONS:   Continue  Zoloft  to 75 mg daily Continue Propranolol  to 40 mg twice daily as need for anxiety and performance anxiety. To continue Hydroxyzine  25 mg three time daily as needed for anxiety. To report worsening symptoms promptly To follow up in 6 months to reassess Provided emergency contact information   Greater than 50% of 30  face to face interview with patient was spent on counseling and coordination of care. Very happy and content right now. We talked about her good stability. Coping well after break up with boyfriend. Excited about her Senior year  in [place].  She continues in psychotherapy on regular basis Will call with worsening symptoms  Provided emergency contact information To follow up in  mid 6 months to reassess Reinforced  risk of SSRI's and pregnancy. She is on [facility].      Redell DELENA Pizza, NP

## 2024-12-29 ENCOUNTER — Telehealth: Payer: Self-pay | Admitting: Behavioral Health

## 2025-01-05 ENCOUNTER — Telehealth: Admitting: Behavioral Health

## 2025-01-12 ENCOUNTER — Telehealth: Admitting: Behavioral Health
